# Patient Record
Sex: Female | Born: 1945 | Race: White | Hispanic: No | Marital: Married | Smoking: Never smoker
Health system: Southern US, Community
[De-identification: ages and names within clinical notes are randomized; demographics above are authoritative.]

---

## 1998-06-20 ENCOUNTER — Other Ambulatory Visit: Admission: RE | Admit: 1998-06-20 | Discharge: 1998-06-20 | Payer: Self-pay | Admitting: Obstetrics and Gynecology

## 1999-08-27 ENCOUNTER — Other Ambulatory Visit: Admission: RE | Admit: 1999-08-27 | Discharge: 1999-08-27 | Payer: Self-pay | Admitting: *Deleted

## 2000-04-23 ENCOUNTER — Encounter: Payer: Self-pay | Admitting: *Deleted

## 2000-04-23 ENCOUNTER — Encounter: Admission: RE | Admit: 2000-04-23 | Discharge: 2000-04-23 | Payer: Self-pay | Admitting: *Deleted

## 2000-08-26 ENCOUNTER — Other Ambulatory Visit: Admission: RE | Admit: 2000-08-26 | Discharge: 2000-08-26 | Payer: Self-pay | Admitting: *Deleted

## 2001-04-24 ENCOUNTER — Ambulatory Visit (HOSPITAL_COMMUNITY): Admission: RE | Admit: 2001-04-24 | Discharge: 2001-04-24 | Payer: Self-pay | Admitting: Family Medicine

## 2001-04-24 ENCOUNTER — Encounter: Payer: Self-pay | Admitting: Family Medicine

## 2001-10-15 ENCOUNTER — Other Ambulatory Visit: Admission: RE | Admit: 2001-10-15 | Discharge: 2001-10-15 | Payer: Self-pay | Admitting: Obstetrics and Gynecology

## 2002-05-11 ENCOUNTER — Encounter: Payer: Self-pay | Admitting: Family Medicine

## 2002-05-11 ENCOUNTER — Ambulatory Visit (HOSPITAL_COMMUNITY): Admission: RE | Admit: 2002-05-11 | Discharge: 2002-05-11 | Payer: Self-pay | Admitting: Family Medicine

## 2002-11-23 ENCOUNTER — Other Ambulatory Visit: Admission: RE | Admit: 2002-11-23 | Discharge: 2002-11-23 | Payer: Self-pay | Admitting: Obstetrics and Gynecology

## 2003-05-13 ENCOUNTER — Encounter: Payer: Self-pay | Admitting: Family Medicine

## 2003-05-13 ENCOUNTER — Ambulatory Visit (HOSPITAL_COMMUNITY): Admission: RE | Admit: 2003-05-13 | Discharge: 2003-05-13 | Payer: Self-pay | Admitting: Family Medicine

## 2003-12-02 ENCOUNTER — Other Ambulatory Visit: Admission: RE | Admit: 2003-12-02 | Discharge: 2003-12-02 | Payer: Self-pay | Admitting: Obstetrics and Gynecology

## 2004-05-16 ENCOUNTER — Ambulatory Visit (HOSPITAL_COMMUNITY): Admission: RE | Admit: 2004-05-16 | Discharge: 2004-05-16 | Payer: Self-pay | Admitting: Family Medicine

## 2005-06-11 ENCOUNTER — Ambulatory Visit (HOSPITAL_COMMUNITY): Admission: RE | Admit: 2005-06-11 | Discharge: 2005-06-11 | Payer: Self-pay | Admitting: Family Medicine

## 2006-06-11 ENCOUNTER — Ambulatory Visit (HOSPITAL_COMMUNITY): Admission: RE | Admit: 2006-06-11 | Discharge: 2006-06-11 | Payer: Self-pay | Admitting: Family Medicine

## 2007-07-08 ENCOUNTER — Ambulatory Visit (HOSPITAL_COMMUNITY): Admission: RE | Admit: 2007-07-08 | Discharge: 2007-07-08 | Payer: Self-pay | Admitting: Family Medicine

## 2008-07-12 ENCOUNTER — Ambulatory Visit (HOSPITAL_COMMUNITY): Admission: RE | Admit: 2008-07-12 | Discharge: 2008-07-12 | Payer: Self-pay | Admitting: Family Medicine

## 2009-07-27 ENCOUNTER — Ambulatory Visit (HOSPITAL_COMMUNITY): Admission: RE | Admit: 2009-07-27 | Discharge: 2009-07-27 | Payer: Self-pay | Admitting: Family Medicine

## 2010-07-30 ENCOUNTER — Ambulatory Visit (HOSPITAL_COMMUNITY): Admission: RE | Admit: 2010-07-30 | Discharge: 2010-07-30 | Payer: Self-pay | Admitting: Family Medicine

## 2011-07-01 ENCOUNTER — Other Ambulatory Visit (HOSPITAL_COMMUNITY): Payer: Self-pay | Admitting: Family Medicine

## 2011-07-01 DIAGNOSIS — Z1231 Encounter for screening mammogram for malignant neoplasm of breast: Secondary | ICD-10-CM

## 2011-08-01 ENCOUNTER — Ambulatory Visit (HOSPITAL_COMMUNITY)
Admission: RE | Admit: 2011-08-01 | Discharge: 2011-08-01 | Disposition: A | Discharge status: Home / Self Care | Payer: 59 | Source: Ambulatory Visit | Attending: Family Medicine | Admitting: Family Medicine

## 2011-08-01 DIAGNOSIS — Z1231 Encounter for screening mammogram for malignant neoplasm of breast: Secondary | ICD-10-CM | POA: Insufficient documentation

## 2012-06-30 ENCOUNTER — Other Ambulatory Visit (HOSPITAL_COMMUNITY): Payer: Self-pay | Admitting: Family Medicine

## 2012-06-30 DIAGNOSIS — Z1231 Encounter for screening mammogram for malignant neoplasm of breast: Secondary | ICD-10-CM

## 2012-08-04 ENCOUNTER — Ambulatory Visit (HOSPITAL_COMMUNITY): Payer: 59

## 2012-08-20 ENCOUNTER — Ambulatory Visit (HOSPITAL_COMMUNITY)
Admission: RE | Admit: 2012-08-20 | Discharge: 2012-08-20 | Disposition: A | Discharge status: Home / Self Care | Payer: 59 | Source: Ambulatory Visit | Attending: Family Medicine | Admitting: Family Medicine

## 2012-08-20 DIAGNOSIS — Z1231 Encounter for screening mammogram for malignant neoplasm of breast: Secondary | ICD-10-CM | POA: Insufficient documentation

## 2012-08-25 ENCOUNTER — Ambulatory Visit (HOSPITAL_COMMUNITY): Payer: 59

## 2013-07-23 ENCOUNTER — Other Ambulatory Visit (HOSPITAL_COMMUNITY): Payer: Self-pay | Admitting: Family Medicine

## 2013-07-23 DIAGNOSIS — Z1231 Encounter for screening mammogram for malignant neoplasm of breast: Secondary | ICD-10-CM

## 2013-08-24 ENCOUNTER — Ambulatory Visit (HOSPITAL_COMMUNITY)
Admission: RE | Admit: 2013-08-24 | Discharge: 2013-08-24 | Disposition: A | Discharge status: Home / Self Care | Payer: 59 | Source: Ambulatory Visit | Attending: Family Medicine | Admitting: Family Medicine

## 2013-08-24 DIAGNOSIS — Z1231 Encounter for screening mammogram for malignant neoplasm of breast: Secondary | ICD-10-CM | POA: Insufficient documentation

## 2013-10-25 ENCOUNTER — Other Ambulatory Visit (HOSPITAL_COMMUNITY): Payer: Self-pay | Admitting: Obstetrics and Gynecology

## 2013-10-25 DIAGNOSIS — N951 Menopausal and female climacteric states: Secondary | ICD-10-CM

## 2013-11-11 ENCOUNTER — Ambulatory Visit (HOSPITAL_COMMUNITY)
Admission: RE | Admit: 2013-11-11 | Discharge: 2013-11-11 | Disposition: A | Discharge status: Home / Self Care | Payer: 59 | Source: Ambulatory Visit | Attending: Obstetrics and Gynecology | Admitting: Obstetrics and Gynecology

## 2013-11-11 DIAGNOSIS — N951 Menopausal and female climacteric states: Secondary | ICD-10-CM

## 2013-11-11 DIAGNOSIS — Z780 Asymptomatic menopausal state: Secondary | ICD-10-CM | POA: Insufficient documentation

## 2013-11-11 DIAGNOSIS — Z13820 Encounter for screening for osteoporosis: Secondary | ICD-10-CM | POA: Insufficient documentation

## 2014-07-28 ENCOUNTER — Other Ambulatory Visit (HOSPITAL_COMMUNITY): Payer: Self-pay | Admitting: Family Medicine

## 2014-07-28 DIAGNOSIS — Z1231 Encounter for screening mammogram for malignant neoplasm of breast: Secondary | ICD-10-CM

## 2014-08-25 ENCOUNTER — Ambulatory Visit (HOSPITAL_COMMUNITY)
Admission: RE | Admit: 2014-08-25 | Discharge: 2014-08-25 | Disposition: A | Discharge status: Home / Self Care | Payer: 59 | Source: Ambulatory Visit | Attending: Family Medicine | Admitting: Family Medicine

## 2014-08-25 DIAGNOSIS — Z1231 Encounter for screening mammogram for malignant neoplasm of breast: Secondary | ICD-10-CM | POA: Insufficient documentation

## 2014-08-29 ENCOUNTER — Other Ambulatory Visit: Payer: Self-pay | Admitting: Family Medicine

## 2014-08-29 DIAGNOSIS — R928 Other abnormal and inconclusive findings on diagnostic imaging of breast: Secondary | ICD-10-CM

## 2014-09-14 ENCOUNTER — Ambulatory Visit
Admission: RE | Admit: 2014-09-14 | Discharge: 2014-09-14 | Disposition: A | Discharge status: Home / Self Care | Payer: 59 | Source: Ambulatory Visit | Attending: Family Medicine | Admitting: Family Medicine

## 2014-09-14 DIAGNOSIS — R928 Other abnormal and inconclusive findings on diagnostic imaging of breast: Secondary | ICD-10-CM

## 2015-05-29 ENCOUNTER — Telehealth: Payer: Self-pay | Admitting: General Practice

## 2015-05-29 NOTE — Telephone Encounter (Signed)
Is requesting Dr. Katrinka Blazing to work her in today.  States her husband is a patient of Dr. Katrinka Blazing.  States she is having pain in her right arm and would like to be seen as soon as possible.

## 2015-05-29 NOTE — Telephone Encounter (Signed)
Spoke to pt, explained to her that we would not be able to work her in today & offered her an appt at 8am in the morning. Pt stated that she did not think she could wait that long so i advised her she could go to [facility] urgent care. Gave her their address & she stated that if she did not go there she would call us back for the 8am appt.

## 2015-05-30 ENCOUNTER — Ambulatory Visit (INDEPENDENT_AMBULATORY_CARE_PROVIDER_SITE_OTHER)
Admission: RE | Admit: 2015-05-30 | Discharge: 2015-05-30 | Disposition: A | Discharge status: Home / Self Care | Payer: 59 | Source: Ambulatory Visit | Attending: Family Medicine | Admitting: Family Medicine

## 2015-05-30 ENCOUNTER — Ambulatory Visit (INDEPENDENT_AMBULATORY_CARE_PROVIDER_SITE_OTHER): Payer: 59 | Admitting: Family Medicine

## 2015-05-30 ENCOUNTER — Encounter: Payer: Self-pay | Admitting: Family Medicine

## 2015-05-30 VITALS — BP 132/82 | HR 77 | Wt 159.0 lb

## 2015-05-30 DIAGNOSIS — M79621 Pain in right upper arm: Secondary | ICD-10-CM | POA: Diagnosis not present

## 2015-05-30 DIAGNOSIS — M25521 Pain in right elbow: Secondary | ICD-10-CM

## 2015-05-30 DIAGNOSIS — M7551 Bursitis of right shoulder: Secondary | ICD-10-CM | POA: Insufficient documentation

## 2015-05-30 NOTE — Patient Instructions (Addendum)
Nice to meet you Lets get xrays today.  Ice 20 minutes 2 times daily. Usually after activity and before bed. pennsaid pinkie amount topically 2 times daily as needed.  Exercises 3-5 times a week.  Vitamin D 2000 IU daily See me again in 3-4 weeks.

## 2015-05-30 NOTE — Assessment & Plan Note (Signed)
Patient given an injection today. We discussed icing regimen home exercises. Patient will get x-rays to rule out any bony abnormality. Patient will try topical anti-inflammatory's. Patient come back and see me again in 3-4 weeks to make sure she is improving.

## 2015-05-30 NOTE — Progress Notes (Signed)
  [facility] [address] [place], [place] [address] Phone: [phone] Subjective:    CC: Right arm pain  [id number] Claire Howard is a 69 y.o. female coming in with complaint of right arm pain. Been worsening over the last several weeks. States that over the course of the last 72 hours severe pain that is keeping her up at night. Denies any radiation. States this seems to be on the lateral aspect of the shoulder but worse with overhead motion as well as reaching across her body. Patient has not ran number any true injury. Patient rates the severity of pain a 7 out of 10. Denies any numbness or any true weakness. Patient though has noticed she's been using her other arm on a more regular basis.    No past medical history on file. No past surgical history on file. Social History  Substance Use Topics  . Smoking status: Not on file  . Smokeless tobacco: Not on file  . Alcohol Use: Not on file   Not on File No family history on file.   Past medical history, social, surgical and family history all reviewed in electronic medical record.   Review of Systems: No headache, visual changes, nausea, vomiting, diarrhea, constipation, dizziness, abdominal pain, skin rash, fevers, chills, night sweats, weight loss, swollen lymph nodes, body aches, joint swelling, muscle aches, chest pain, shortness of breath, mood changes.   Objective Blood pressure 132/82, pulse 77, weight 159 lb (72.122 kg), SpO2 98 %.  General: No apparent distress alert and oriented x3 mood and affect normal, dressed appropriately.  HEENT: Pupils equal, extraocular movements intact  Respiratory: Patient's speak in full sentences and does not appear short of breath  Cardiovascular: No lower extremity edema, non tender, no erythema  Skin: Warm dry intact with no signs of infection or rash on extremities or on axial skeleton.  Abdomen: Soft nontender  Neuro: Cranial nerves II through XII are  intact, neurovascularly intact in all extremities with 2+ DTRs and 2+ pulses.  Lymph: No lymphadenopathy of posterior or anterior cervical chain or axillae bilaterally.  Gait normal with good balance and coordination.  MSK:  Non tender with full range of motion and good stability and symmetric strength and tone of  elbows, wrist, hip, knee and ankles bilaterally.   Neck: Inspection unremarkable. No palpable stepoffs. Negative Spurling's maneuver. Full neck range of motion Grip strength and sensation normal in bilateral hands Strength good C4 to T1 distribution No sensory change to C4 to T1 Negative Hoffman sign bilaterally Reflexes normal  Shoulder: Right Inspection reveals no abnormalities, atrophy or asymmetry. Palpation is normal with no tenderness over AC joint or bicipital groove. ROM is full in all planes passively. Rotator cuff strength normal throughout. signs of impingement with positive Neer and Hawkin's tests, but negative empty can sign. Speeds and Yergason's tests normal. No labral pathology noted with negative Obrien's, negative clunk and good stability. Normal scapular function observed. No painful arc and no drop arm sign. No apprehension sign  Procedure note After informed written and verbal consent, patient was seated on exam table. Right shoulder was prepped with alcohol swab and utilizing posterior approach, patient's right glenohumeral space was injected with 4:1  marcaine 0.5%: Kenalog /dL. Patient tolerated the procedure well without immediate complications.      Impression and Recommendations:     This case required medical decision making of moderate complexity.

## 2015-05-30 NOTE — Progress Notes (Signed)
Pre visit review using our clinic review tool, if applicable. No additional management support is needed unless otherwise documented below in the visit note. 

## 2015-06-08 ENCOUNTER — Telehealth: Payer: Self-pay | Admitting: Family Medicine

## 2015-06-08 NOTE — Telephone Encounter (Signed)
I think it is fine, if bruising gets worse stop it for 3 days.  Otherwise likely not the problem and might be dry skin and to use lotion.

## 2015-06-08 NOTE — Telephone Encounter (Signed)
Patient states she has used anti inflammatory cream on arm today.  Patient states she had a bruise come up this morning.  She does not think it is a reaction to the cream because she has been using the cream for awhile.  I asked patient if she would like to know if it was normal based on the condition she was diagnosised with?  Patient states she would just like to know if she needs to come in to see Dr. Katrinka Blazing.

## 2015-06-08 NOTE — Telephone Encounter (Signed)
Lmovm

## 2015-06-21 ENCOUNTER — Ambulatory Visit (INDEPENDENT_AMBULATORY_CARE_PROVIDER_SITE_OTHER): Payer: 59 | Admitting: Family Medicine

## 2015-06-21 ENCOUNTER — Encounter: Payer: Self-pay | Admitting: Family Medicine

## 2015-06-21 VITALS — BP 138/84 | HR 85 | Wt 159.0 lb

## 2015-06-21 DIAGNOSIS — M7551 Bursitis of right shoulder: Secondary | ICD-10-CM

## 2015-06-21 NOTE — Assessment & Plan Note (Signed)
This have underlying arthritis. We discussed other conservative therapies. We discussed icing regimen. Patient will continue with the same regimen and will come back and see me on an as-needed basis.

## 2015-06-21 NOTE — Progress Notes (Signed)
  Claire Howard [address] [place], [place] [address] Phone: [phone] Subjective:    CC: Right arm pain follow-up   [id number] Claire Howard is a 69 y.o. female coming in with complaint of right arm pain. Patient was found to have moderate osteophytic changes of the right shoulder with some mild degenerative changes of the rotator cuff. Patient elected to have an injection and was doing home exercises including an icing protocol. Patient states she feels 95% better. States that no longer have any severe pain. Had some bruising after the injection. Patient states that she can do all activities of daily living and sleeping comfortably. Very happy with the results.    No past medical history on file. No past surgical history on file. Social History  Substance Use Topics  . Smoking status: Never Smoker   . Smokeless tobacco: None  . Alcohol Use: None   Not on File No family history on file.   Past medical history, social, surgical and family history all reviewed in electronic medical record.   Review of Systems: No headache, visual changes, nausea, vomiting, diarrhea, constipation, dizziness, abdominal pain, skin rash, fevers, chills, night sweats, weight loss, swollen lymph nodes, body aches, joint swelling, muscle aches, chest pain, shortness of breath, mood changes.   Objective Blood pressure 138/84, pulse 85, weight 159 lb (72.122 kg), SpO2 97 %.  General: No apparent distress alert and oriented x3 mood and affect normal, dressed appropriately.  HEENT: Pupils equal, extraocular movements intact  Respiratory: Patient's speak in full sentences and does not appear short of breath  Cardiovascular: No lower extremity edema, non tender, no erythema  Skin: Warm dry intact with no signs of infection or rash on extremities or on axial skeleton.  Abdomen: Soft nontender  Neuro: Cranial nerves II through XII are intact, neurovascularly intact in all  extremities with 2+ DTRs and 2+ pulses.  Lymph: No lymphadenopathy of posterior or anterior cervical chain or axillae bilaterally.  Gait normal with good balance and coordination.  MSK:  Non tender with full range of motion and good stability and symmetric strength and tone of  elbows, wrist, hip, knee and ankles bilaterally.   Neck: Inspection unremarkable. No palpable stepoffs. Negative Spurling's maneuver. Full neck range of motion Grip strength and sensation normal in bilateral hands Strength good C4 to T1 distribution No sensory change to C4 to T1 Negative Hoffman sign bilaterally Reflexes normal  Shoulder: Right Inspection reveals no abnormalities, atrophy or asymmetry. Palpation is normal with no tenderness over AC joint or bicipital groove. ROM is full in all planes passively. Rotator cuff strength normal throughout. No signs of impingement Speeds and Yergason's tests normal. No labral pathology noted with negative Obrien's, negative clunk and good stability. Normal scapular function observed. No painful arc and no drop arm sign. No apprehension sign        Impression and Recommendations:     This case required medical decision making of moderate complexity.

## 2015-06-21 NOTE — Patient Instructions (Addendum)
Good to see you Kepe up with the icing Try to do the exercises another 6 weeks at least 2-3 times a week.  Avoid significant lifting overhead For the arthritis in the shoulder consider increasing the vitamin D to 3000-4000 IU daily Turmeric  daily Tylenol  3 times a week has the best eidence for arthritis pain but do need ot take 3 times daily.

## 2015-06-21 NOTE — Progress Notes (Signed)
Pre visit review using our clinic review tool, if applicable. No additional management support is needed unless otherwise documented below in the visit note. 

## 2017-08-05 IMAGING — CR DG SHOULDER 2+V*R*
3 series · 3 of 3 positions shown · non-contrast
Comparison: None.

CLINICAL DATA: Pain for approximately 4 weeks

EXAM:
RIGHT SHOULDER - 2+ VIEW

[view not recorded (1 of 3)]
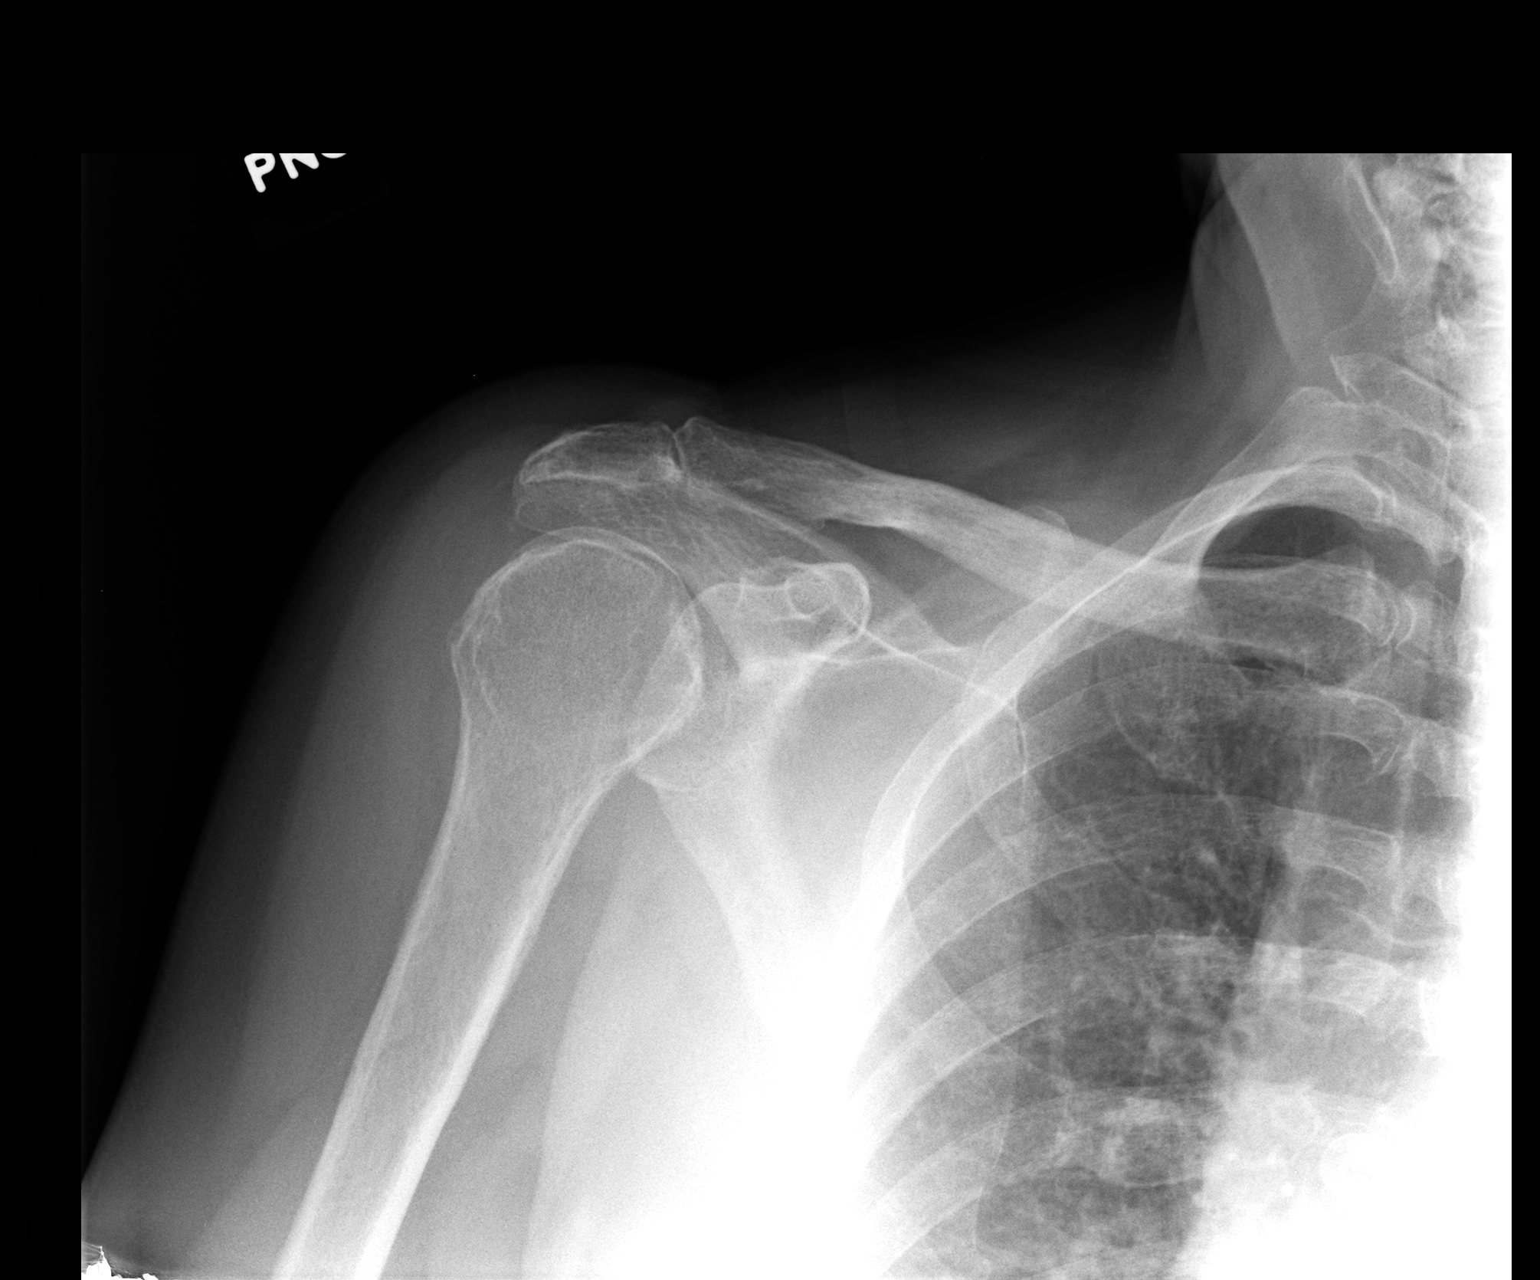

[view not recorded (2 of 3)]
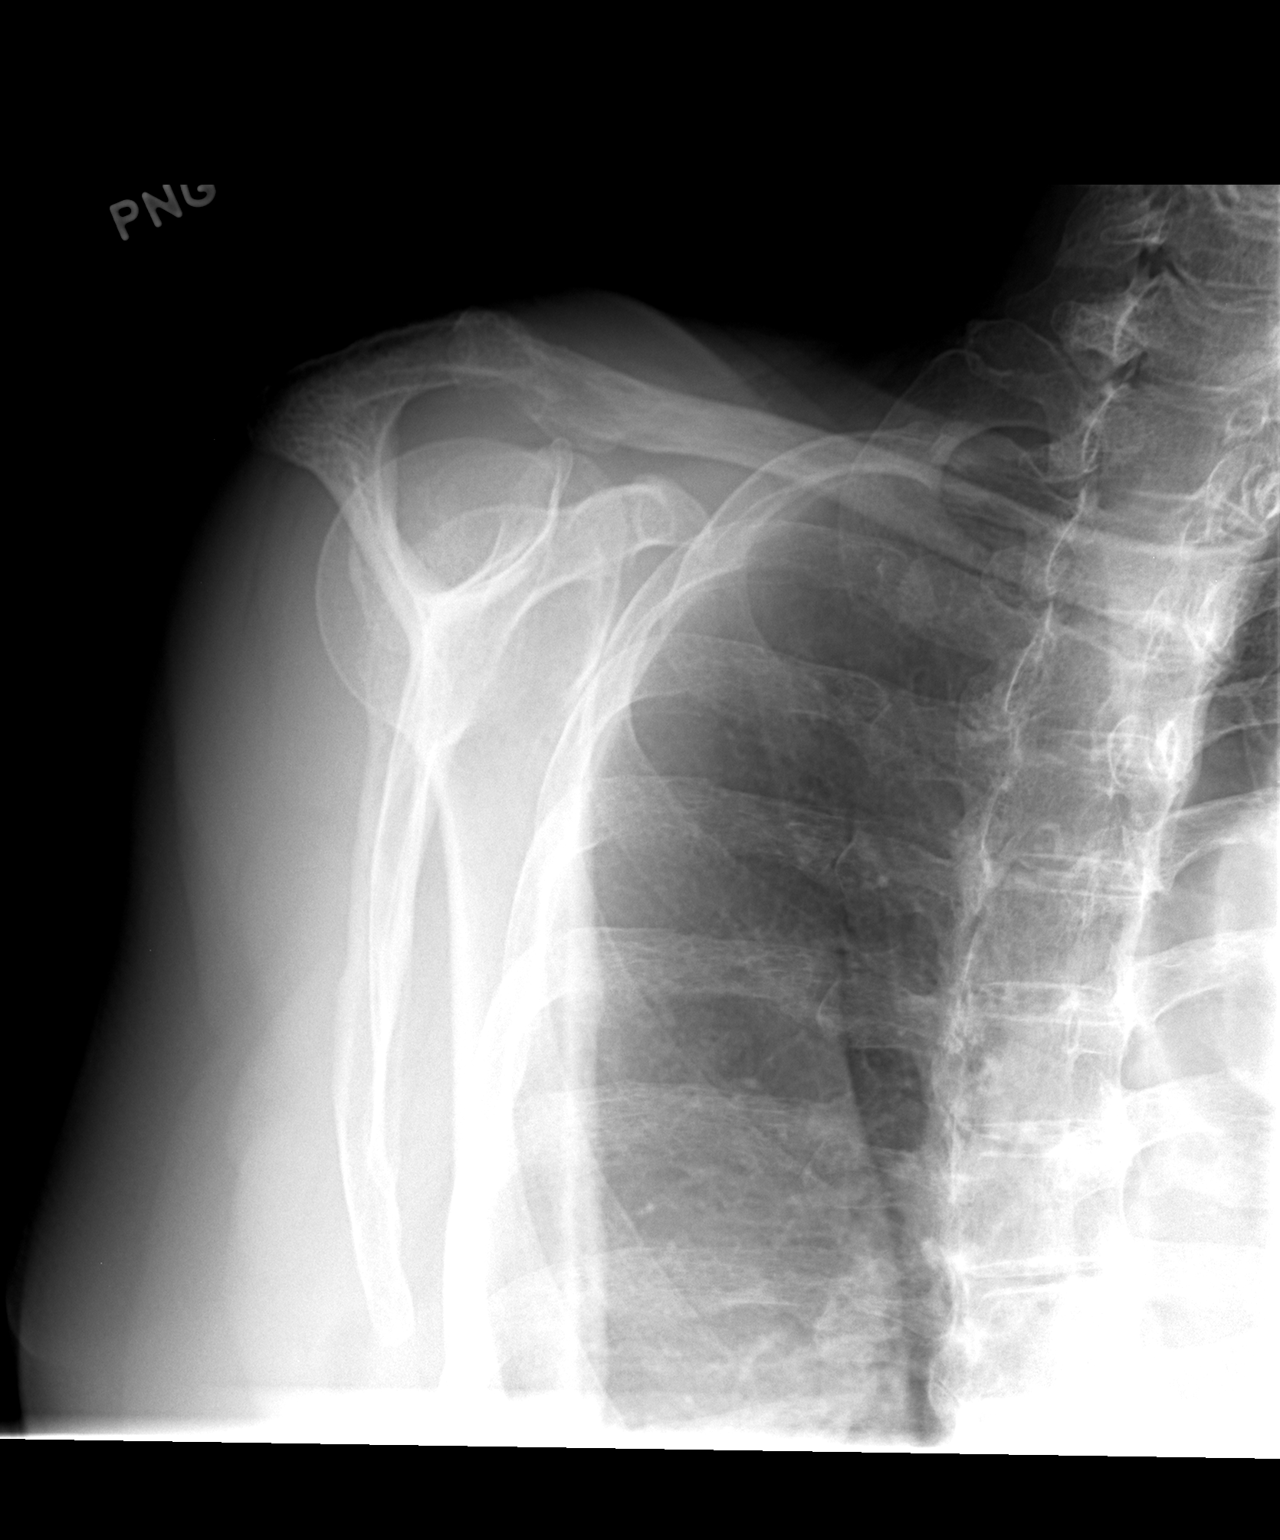

[view not recorded (3 of 3)]
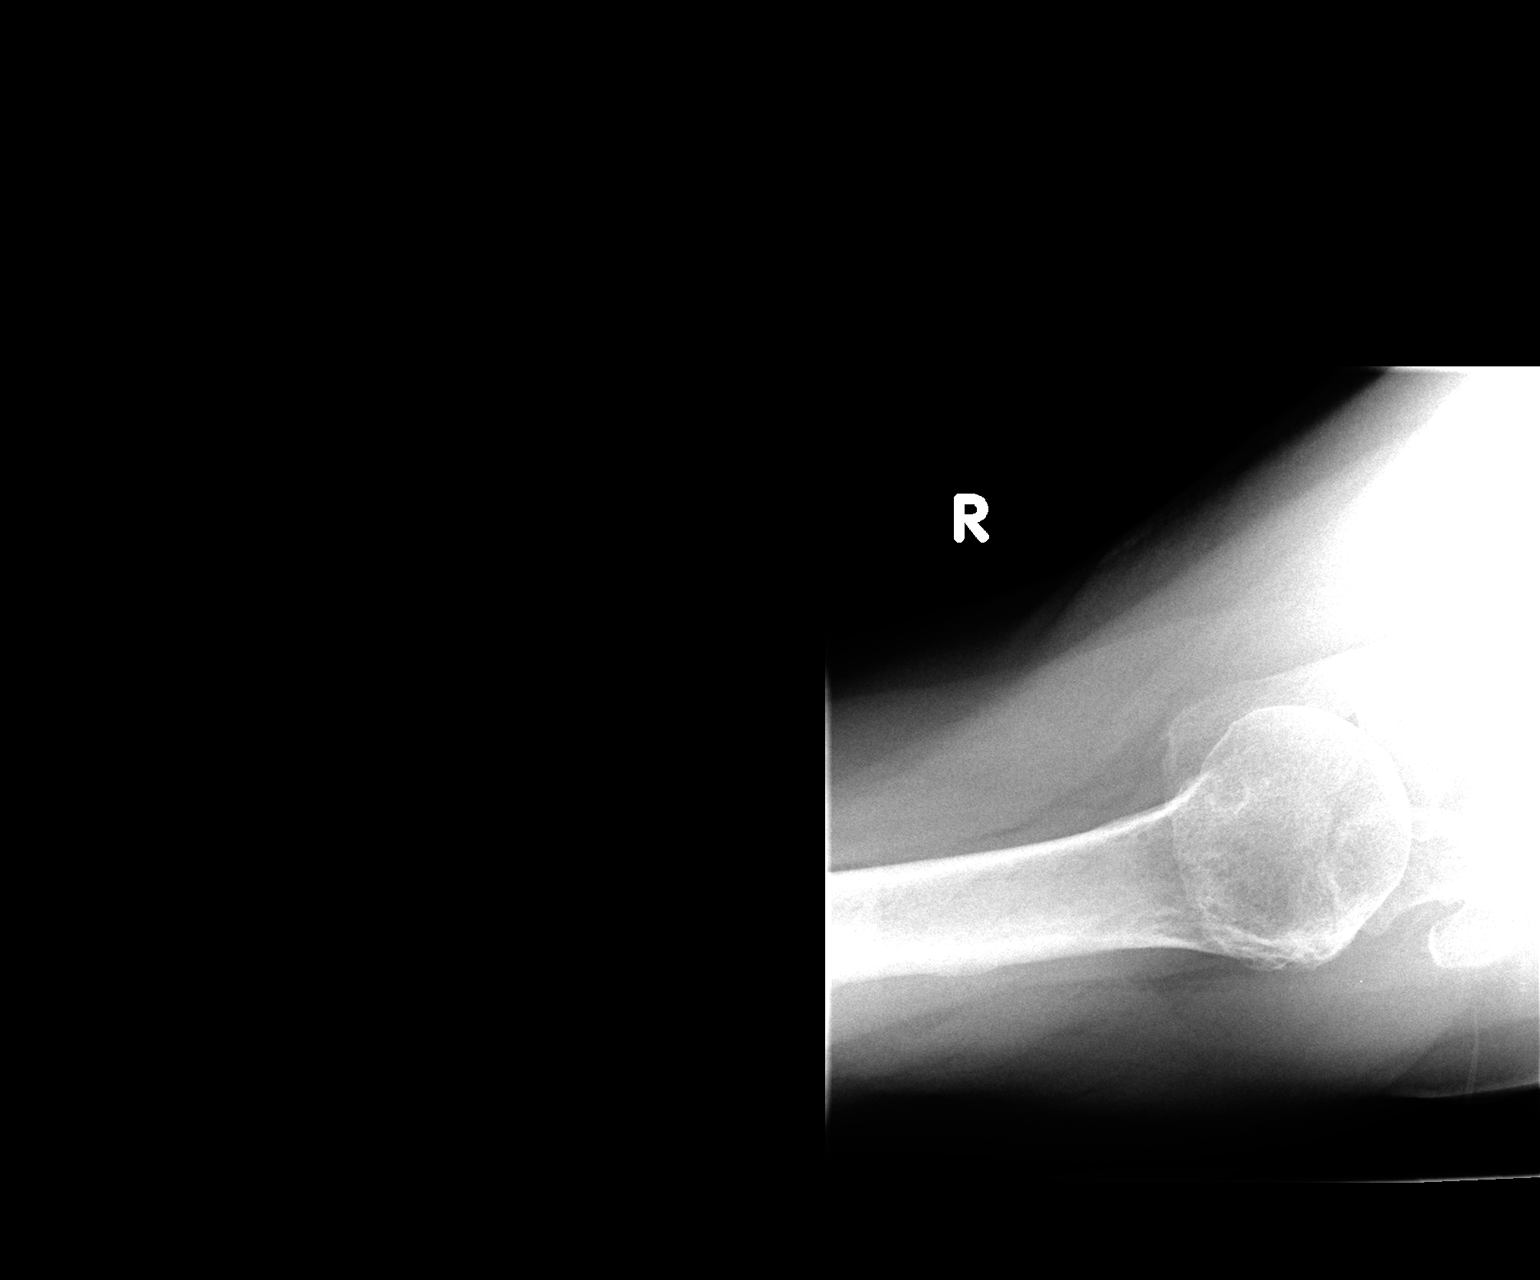

[3 of 3 positions shown; findings below may reference images not displayed]

FINDINGS: Frontal, Y scapular, and axillary images obtained. There is no
fracture or dislocation. There is osteoarthritic change in the
glenohumeral and acromioclavicular joints. No erosive change.
IMPRESSION: Moderate generalized osteoarthritic change. No fracture or
dislocation.

## 2022-05-22 NOTE — Progress Notes (Deleted)
  [facility] [address] [place] [address] Phone: [phone] Subjective:    I'm seeing this patient by the request  of:  No primary care provider on file.  CC:   [id number]  Claire Howard is a 76 y.o. female coming in with complaint of X knee and leg pain. Seen in 2016 for shoulder pain. Patient states       No past medical history on file. No past surgical history on file. Social History   Socioeconomic History   Marital status: Married    Spouse name: Not on file   Number of children: Not on file   Years of education: Not on file   Highest education level: Not on file  Occupational History   Not on file  Tobacco Use   Smoking status: Never   Smokeless tobacco: Not on file  Substance and Sexual Activity   Alcohol use: Not on file   Drug use: Not on file   Sexual activity: Yes    Partners: Female  Other Topics Concern   Not on file  Social History Narrative   Not on file   Social Determinants of Health   Financial Resource Strain: Not on file  Food Insecurity: Not on file  Transportation Needs: Not on file  Physical Activity: Not on file  Stress: Not on file  Social Connections: Not on file   Not on File No family history on file. No current outpatient medications on file.   Reviewed prior external information including notes and imaging from  primary care provider As well as notes that were available from care everywhere and other healthcare systems.  Past medical history, social, surgical and family history all reviewed in electronic medical record.  No pertanent information unless stated regarding to the chief complaint.   Review of Systems:  No headache, visual changes, nausea, vomiting, diarrhea, constipation, dizziness, abdominal pain, skin rash, fevers, chills, night sweats, weight loss, swollen lymph nodes, body aches, joint swelling, chest pain, shortness of breath, mood changes. POSITIVE muscle  aches  Objective  There were no vitals taken for this visit.   General: No apparent distress alert and oriented x3 mood and affect normal, dressed appropriately.  HEENT: Pupils equal, extraocular movements intact  Respiratory: Patient's speak in full sentences and does not appear short of breath  Cardiovascular: No lower extremity edema, non tender, no erythema      Impression and Recommendations:

## 2022-05-24 ENCOUNTER — Ambulatory Visit: Payer: 59 | Admitting: Family Medicine

## 2022-05-30 ENCOUNTER — Ambulatory Visit (INDEPENDENT_AMBULATORY_CARE_PROVIDER_SITE_OTHER): Payer: 59

## 2022-05-30 ENCOUNTER — Ambulatory Visit: Payer: Self-pay

## 2022-05-30 ENCOUNTER — Encounter: Payer: Self-pay | Admitting: Family Medicine

## 2022-05-30 ENCOUNTER — Ambulatory Visit: Payer: 59 | Admitting: Family Medicine

## 2022-05-30 VITALS — BP 142/88 | HR 78 | Ht 63.0 in | Wt 159.0 lb

## 2022-05-30 DIAGNOSIS — M25462 Effusion, left knee: Secondary | ICD-10-CM

## 2022-05-30 DIAGNOSIS — M25562 Pain in left knee: Secondary | ICD-10-CM

## 2022-05-30 NOTE — Progress Notes (Signed)
  [facility] [address] [place] [address] Phone: [phone] Subjective:    I'm seeing this patient by the request  of:  Blair Heys, MD  CC: Left knee pain  [id number]  Claire Howard is a 76 y.o. female coming in with complaint of L knee pain. Patient fell on stairs on July 9th. Grabbed the handrail and she landed on her knees. Pain in anterior aspect that radiates into the lower leg. Also has swelling in L ankle. Pain moves around to posterior aspect and she is having trouble sleeping due to pain. Wearing brace during the day. Does feel like pain is improving but slowly. Pain worse when she is on her feet.     Reviewed prior external information including notes and imaging from  primary care provider As well as notes that were available from care everywhere and other healthcare systems.  Past medical history, social, surgical and family history all reviewed in electronic medical record.  No pertanent information unless stated regarding to the chief complaint.   Review of Systems:  No headache, visual changes, nausea, vomiting, diarrhea, constipation, dizziness, abdominal pain, skin rash, fevers, chills, night sweats, weight loss, swollen lymph nodes, body aches, joint swelling, chest pain, shortness of breath, mood changes. POSITIVE muscle aches  Objective  Blood pressure (!) 142/88, pulse 78, height 5\' 3"  (1.6 m), weight 159 lb (72.1 kg), SpO2 96 %.   General: No apparent distress alert and oriented x3 mood and affect normal, dressed appropriately.  HEENT: Pupils equal, extraocular movements intact  Respiratory: Patient's speak in full sentences and does not appear short of breath  Cardiovascular: No lower extremity edema, non tender, no erythema  Left knee does have a significant swelling noted.  Antalgic gait favoring the left knee.  Patient does have tenderness to palpation over the lateral aspect of the knee.  Positive McMurray's  noted.  No significant gapping noted with varus and varus.  Patient has limited flexion secondary to the tightness in the patellofemoral joint and swelling.  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  There is significant effusion noted of the patellofemoral joint.  No cortical irregularity of the tibial plateau noted. Impression: Likely meniscal tear with effusion  Procedure: Real-time Ultrasound Guided Injection of left knee Device: GE Logiq Q7 Ultrasound guided injection is preferred based studies that show increased duration, increased effect, greater accuracy, decreased procedural pain, increased response rate, and decreased cost with ultrasound guided versus blind injection.  Verbal informed consent obtained.  Time-out conducted.  Noted no overlying erythema, induration, or other signs of local infection.  Skin prepped in a sterile fashion.  Local anesthesia: Topical Ethyl chloride.  With sterile technique and under real time ultrasound guidance: With a 22-gauge 2 inch needle patient was injected with 4 cc of 0.5% Marcaine and aspirated 40 cc of straw light-colored fluid then injected 1 cc of Kenalog 40 mg/dL. This was from a superior lateral approach.  Completed without difficulty  Pain immediately improved suggesting accurate placement of the medication.  Advised to call if fevers/chills, erythema, induration, drainage, or persistent bleeding.  Impression: Technically successful ultrasound guided injection.    Impression and Recommendations:

## 2022-05-30 NOTE — Assessment & Plan Note (Signed)
Patient had aspiration done today.  Tolerated the procedure well.  Discussed with patient about icing regimen and home exercises.  X-rays were did not show any acute fracture noted.  Ultrasound did show the patient did have a likely a lateral meniscal injury and questionable LCL injury.  Patient given a hinged brace at this moment and home exercises.  Follow-up with me again in 6 to 8 weeks.

## 2022-05-30 NOTE — Patient Instructions (Addendum)
Drained L knee Meniscus exercises Ice 20 min Voltaren gel See me again 5-6 weeks

## 2022-05-31 LAB — SYNOVIAL FLUID ANALYSIS, COMPLETE
Basophils, %: 0 %
Eosinophils-Synovial: 0 % (ref 0–2)
Lymphocytes-Synovial Fld: 16 % (ref 0–74)
Monocyte/Macrophage: 79 % — ABNORMAL HIGH (ref 0–69)
Neutrophil, Synovial: 5 % (ref 0–24)
Synoviocytes, %: 0 % (ref 0–15)
WBC, Synovial: 235 cells/uL — ABNORMAL HIGH (ref <150)

## 2022-06-05 NOTE — Progress Notes (Signed)
Kenalog added  

## 2022-07-09 NOTE — Progress Notes (Unsigned)
  [facility] [place] [address] [place] Phone: [phone] Subjective:   IVilma Meckel, am serving as a scribe for Dr. Hulan Saas.  I'm seeing this patient by the request  of:  Gaynelle Arabian, MD  CC: Knee pain and swelling follow-up  [id number]  05/30/2022 Patient had aspiration done today.  Tolerated the procedure well.  Discussed with patient about icing regimen and home exercises.  X-rays were did not show any acute fracture noted.  Ultrasound did show the patient did have a likely a lateral meniscal injury and questionable LCL injury.  Patient given a hinged brace at this moment and home exercises.  Follow-up with me again in 6 to 8 weeks.  Updated 07/10/2022 LATEASHA BREUER is a 76 y.o. female coming in with complaint of L knee pain. Doing better. Wants to discuss labs and procedures done at last visit. No other complaints.  States not having any pain at all at this time. X-rays were independently visualized by me showing no significant arthritic changes and only effusion noted.       No past surgical history on file.   Objective  Blood pressure 122/86, pulse 73, height 5\' 3"  (1.6 m), weight 158 lb (71.7 kg), SpO2 98 %.   General: No apparent distress alert and oriented x3 mood and affect normal, dressed appropriately.  HEENT: Pupils equal, extraocular movements intact  Respiratory: Patient's speak in full sentences and does not appear short of breath  Cardiovascular: No lower extremity edema, non tender, no erythema  Left knee exam does not have any significant swelling noted at this time.  Patient has full range of motion noted even with the brace on at this point point.  No instability    Impression and Recommendations:    The above documentation has been reviewed and is accurate and complete Lyndal Pulley, DO

## 2022-07-10 ENCOUNTER — Ambulatory Visit (INDEPENDENT_AMBULATORY_CARE_PROVIDER_SITE_OTHER): Payer: 59 | Admitting: Family Medicine

## 2022-07-10 ENCOUNTER — Ambulatory Visit: Payer: Self-pay

## 2022-07-10 VITALS — BP 122/86 | HR 73 | Ht 63.0 in | Wt 158.0 lb

## 2022-07-10 DIAGNOSIS — M25562 Pain in left knee: Secondary | ICD-10-CM | POA: Diagnosis not present

## 2022-07-10 DIAGNOSIS — M25462 Effusion, left knee: Secondary | ICD-10-CM

## 2022-07-10 NOTE — Assessment & Plan Note (Signed)
Seems to be completely resolved at this time.  Discussed with patient some to continue with the brace as needed. Patient does have significant instability continue with conservative therapy follow-up with me as needed

## 2023-04-29 DIAGNOSIS — H905 Unspecified sensorineural hearing loss: Secondary | ICD-10-CM | POA: Diagnosis not present

## 2023-04-29 DIAGNOSIS — H6123 Impacted cerumen, bilateral: Secondary | ICD-10-CM | POA: Diagnosis not present

## 2023-05-13 DIAGNOSIS — Z1231 Encounter for screening mammogram for malignant neoplasm of breast: Secondary | ICD-10-CM | POA: Diagnosis not present

## 2023-05-27 DIAGNOSIS — H903 Sensorineural hearing loss, bilateral: Secondary | ICD-10-CM | POA: Diagnosis not present

## 2023-05-27 DIAGNOSIS — H6122 Impacted cerumen, left ear: Secondary | ICD-10-CM | POA: Diagnosis not present

## 2023-06-25 DIAGNOSIS — Z961 Presence of intraocular lens: Secondary | ICD-10-CM | POA: Diagnosis not present

## 2023-06-25 DIAGNOSIS — H25811 Combined forms of age-related cataract, right eye: Secondary | ICD-10-CM | POA: Diagnosis not present

## 2023-06-25 DIAGNOSIS — H26492 Other secondary cataract, left eye: Secondary | ICD-10-CM | POA: Diagnosis not present

## 2024-05-24 DIAGNOSIS — Z1231 Encounter for screening mammogram for malignant neoplasm of breast: Secondary | ICD-10-CM | POA: Diagnosis not present
